# Patient Record
Sex: Male | Born: 1995 | Hispanic: Yes | Marital: Single | Smoking: Never smoker
Health system: Southern US, Community
[De-identification: ages and names within clinical notes are randomized; demographics above are authoritative.]

## PROBLEM LIST (undated history)

## (undated) DIAGNOSIS — Z789 Other specified health status: Secondary | ICD-10-CM

## (undated) HISTORY — PX: WISDOM TOOTH EXTRACTION: SHX21

---
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
## 2014-01-26 ENCOUNTER — Emergency Department (HOSPITAL_COMMUNITY): Payer: Medicaid Other

## 2014-01-26 ENCOUNTER — Emergency Department (HOSPITAL_COMMUNITY)
Admission: EM | Admit: 2014-01-26 | Discharge: 2014-01-27 | Disposition: A | Discharge status: Home / Self Care | Payer: Medicaid Other | Attending: Emergency Medicine | Admitting: Emergency Medicine

## 2014-01-26 ENCOUNTER — Encounter (HOSPITAL_COMMUNITY): Payer: Self-pay | Admitting: Emergency Medicine

## 2014-01-26 DIAGNOSIS — F17200 Nicotine dependence, unspecified, uncomplicated: Secondary | ICD-10-CM | POA: Insufficient documentation

## 2014-01-26 DIAGNOSIS — S99929A Unspecified injury of unspecified foot, initial encounter: Principal | ICD-10-CM

## 2014-01-26 DIAGNOSIS — S99919A Unspecified injury of unspecified ankle, initial encounter: Secondary | ICD-10-CM | POA: Diagnosis not present

## 2014-01-26 DIAGNOSIS — Y9289 Other specified places as the place of occurrence of the external cause: Secondary | ICD-10-CM | POA: Insufficient documentation

## 2014-01-26 DIAGNOSIS — Z79899 Other long term (current) drug therapy: Secondary | ICD-10-CM | POA: Insufficient documentation

## 2014-01-26 DIAGNOSIS — S8990XA Unspecified injury of unspecified lower leg, initial encounter: Secondary | ICD-10-CM | POA: Insufficient documentation

## 2014-01-26 DIAGNOSIS — Y9351 Activity, roller skating (inline) and skateboarding: Secondary | ICD-10-CM | POA: Diagnosis not present

## 2014-01-26 DIAGNOSIS — S99912A Unspecified injury of left ankle, initial encounter: Secondary | ICD-10-CM

## 2014-01-26 MED ORDER — METHOCARBAMOL 500 MG PO TABS: 500.0000 mg | ORAL_TABLET | Freq: Two times a day (BID) | ORAL | Status: DC

## 2014-01-26 MED ORDER — OXYCODONE-ACETAMINOPHEN 5-325 MG PO TABS
1.0000 | ORAL_TABLET | Freq: Once | ORAL | Status: AC
Start: 2014-01-26 — End: 2014-01-26
  Administered 2014-01-26: 1 via ORAL
  Filled 2014-01-26: qty 1

## 2014-01-26 MED ORDER — OXYCODONE-ACETAMINOPHEN 10-325 MG PO TABS: 1.0000 | ORAL_TABLET | Freq: Four times a day (QID) | ORAL | Status: DC | PRN

## 2014-01-26 MED ORDER — OXYCODONE-ACETAMINOPHEN 5-325 MG PO TABS
2.0000 | ORAL_TABLET | Freq: Once | ORAL | Status: AC
  Administered 2014-01-26: 2 via ORAL
  Filled 2014-01-26: qty 2

## 2014-01-26 NOTE — ED Notes (Signed)
Lorelle FormosaHanna, PA-C, at the bedside.  Reported pain is still 10/10 after percocet.  She gives verbal order for 1 more percocet tab and to prepare for discharge after cam walker is placed. Ortho  Paged.

## 2014-01-26 NOTE — ED Notes (Signed)
The pt injured his lt ankle roller blading just pta here   Ankle swollen medially and laterally.

## 2014-01-26 NOTE — ED Notes (Signed)
Patient still off the unit for x-ray

## 2014-01-26 NOTE — ED Provider Notes (Signed)
CSN: [id number]     Arrival date & time 01/26/14  2144 History   This chart was scribed for non-physician practitioner working with Glenn Gaskinsonald W Wickline, MD, by Modena JanskyAlbert Mejia, ED Scribe. This patient was seen in room TR08C/TR08C and the patient's care was started at 10:23 PM.   Chief Complaint  Patient presents with  . Ankle Pain   Patient is a 18 y.o. male presenting with ankle pain. The history is provided by the patient and medical records. No language interpreter was used.  Ankle Pain Associated symptoms: no back pain, no fever and no neck pain    HPI Comments: Glenn Mejia is a 18 y.o. male who presents to the Emergency Department complaining of constant moderate left ankle pain that started about an hour ago. He states that he injured his left ankle trying to do a trick on roller skates. He reports associated swelling and significant pain.  He did fall and has been unable to ambulate since that time.  He denies hitting his head, LOC, neck or back pain.  He states that he did not take any medication PTA. Pt denies alleviating factors and movement and palpation makes it worse.    History reviewed. No pertinent past medical history. History reviewed. No pertinent past surgical history. No family history on file. History  Substance Use Topics  . Smoking status: Current Every Day Smoker  . Smokeless tobacco: Not on file  . Alcohol Use: Yes    Review of Systems  Constitutional: Negative for fever and chills.  Gastrointestinal: Negative for nausea and vomiting.  Musculoskeletal: Positive for arthralgias, gait problem (unable to ambulate 2/2 pain and swelling) and joint swelling. Negative for back pain, neck pain and neck stiffness.  Skin: Negative for wound.  Neurological: Negative for numbness.  Hematological: Does not bruise/bleed easily.  Psychiatric/Behavioral: The patient is not nervous/anxious.   All other systems reviewed and are negative.   Allergies  Review of  patient's allergies indicates no known allergies.  Home Medications   Prior to Admission medications   Medication Sig Start Date End Date Taking? Authorizing Provider  methocarbamol (ROBAXIN) 500 MG tablet Take 1 tablet (500 mg total) by mouth 2 (two) times daily. 01/26/14   Glenn Geesey, PA-C  oxyCODONE-acetaminophen (PERCOCET) 10-325 MG per tablet Take 1 tablet by mouth every 6 (six) hours as needed for pain. 01/26/14   Glenn Satre, PA-C   BP 128/77  Pulse 85  Temp(Src) 98.1 F (36.7 C)  Resp 18  Physical Exam  Nursing note and vitals reviewed. Constitutional: He is oriented to person, place, and time. He appears well-developed and well-nourished. No distress.  HENT:  Head: Normocephalic and atraumatic.  Eyes: Conjunctivae are normal.  Neck: Normal range of motion.  Cardiovascular: Normal rate, regular rhythm and intact distal pulses.   Capillary refill < 3 sec  Pulmonary/Chest: Effort normal and breath sounds normal.  Musculoskeletal: He exhibits tenderness. He exhibits no edema.  ROM: very little ROM of the left ankle.   Significant swelling and ecchymosis of the lateral and medial portions of the ankle TTP along both the lateral and medial malleolus and joint lines  Neurological: He is alert and oriented to person, place, and time. Coordination normal.  Sensation intact to dull and sharp Strength 1/5 with resisted dorsiflexion and plantar flexion of the ankle 2/2 pain.    Skin: Skin is warm and dry. He is not diaphoretic.  No tenting of the skin  Psychiatric: He has a normal  mood and affect.    ED Course  Procedures (including critical care time) DIAGNOSTIC STUDIES:  COORDINATION OF CARE: 10:27 PM- Pt advised of plan for treatment and pt agrees.  Labs Review Labs Reviewed - No data to display  Imaging Review Dg Ankle Complete Left  01/26/2014   CLINICAL DATA:  Ankle pain.  EXAM: LEFT ANKLE COMPLETE - 3+ VIEW  COMPARISON:  None.  FINDINGS: There is  relative widening of the medial clear space of the ankle. No acute fracture. No dislocation. Marked soft tissue swelling, especially medially.  IMPRESSION: Widening of the medial ankle suggesting ligamentous tear/insufficiency.   Electronically Signed   By: Glenn Mejia M.D.   On: 01/26/2014 23:13     EKG Interpretation None      MDM   Final diagnoses:  Ankle injury, left, initial encounter   [facility] Glenn Mejia presents with left ankle swelling and pain while roller skating tonight.  Patient X-Ray negative for obvious fracture or dislocation, but concern for ligamentous injury. Pain managed in ED. Pt advised to follow up with orthopedics for further evaluation and treatment.  Patient given CAM walker and crutches while in ED, conservative therapy recommended and discussed. Patient will be dc home & is agreeable with above plan.  I have personally reviewed patient's vitals, nursing note and any pertinent labs or imaging.  At this time, it has been determined that no acute conditions requiring further emergency intervention. The patient/guardian have been advised of the diagnosis and plan. I reviewed all labs and imaging including any potential incidental findings. We have discussed signs and symptoms that warrant return to the ED, such as increasing pain, fevers, SOB.  Patient/guardian has voiced understanding and agreed to follow-up with the PCP or specialist in 3 days.  Vital signs are stable at discharge.   BP 128/77  Pulse 85  Temp(Src) 98.1 F (36.7 C)  Resp 18   I personally performed the services described in this documentation, which was scribed in my presence. The recorded information has been reviewed and is accurate.          Glenn Client Edin Kon, PA-C 01/26/14 2346

## 2014-01-26 NOTE — Discharge Instructions (Signed)
1. Medications: robaxin, percocet, usual home medications 2. Treatment: rest, drink plenty of fluids, wear brace and use crutches until seen by orthopedics 3. Follow Up: Please followup with Dr. Carola FrostHandy in 1 week for further evaluation  Ankle Sprain An ankle sprain is an injury to the strong, fibrous tissues (ligaments) that hold the bones of your ankle joint together.  CAUSES An ankle sprain is usually caused by a fall or by twisting your ankle. Ankle sprains most commonly occur when you step on the outer edge of your foot, and your ankle turns inward. People who participate in sports are more prone to these types of injuries.  SYMPTOMS   Pain in your ankle. The pain may be present at rest or only when you are trying to stand or walk.  Swelling.  Bruising. Bruising may develop immediately or within 1 to 2 days after your injury.  Difficulty standing or walking, particularly when turning corners or changing directions. DIAGNOSIS  Your caregiver will ask you details about your injury and perform a physical exam of your ankle to determine if you have an ankle sprain. During the physical exam, your caregiver will press on and apply pressure to specific areas of your foot and ankle. Your caregiver will try to move your ankle in certain ways. An X-ray exam may be done to be sure a bone was not broken or a ligament did not separate from one of the bones in your ankle (avulsion fracture).  TREATMENT  Certain types of braces can help stabilize your ankle. Your caregiver can make a recommendation for this. Your caregiver may recommend the use of medicine for pain. If your sprain is severe, your caregiver may refer you to a surgeon who helps to restore function to parts of your skeletal system (orthopedist) or a physical therapist. HOME CARE INSTRUCTIONS   Apply ice to your injury for 1-2 days or as directed by your caregiver. Applying ice helps to reduce inflammation and pain.  Put ice in a plastic  bag.  Place a towel between your skin and the bag.  Leave the ice on for 15-20 minutes at a time, every 2 hours while you are awake.  Only take over-the-counter or prescription medicines for pain, discomfort, or fever as directed by your caregiver.  Elevate your injured ankle above the level of your heart as much as possible for 2-3 days.  If your caregiver recommends crutches, use them as instructed. Gradually put weight on the affected ankle. Continue to use crutches or a cane until you can walk without feeling pain in your ankle.  If you have a plaster splint, wear the splint as directed by your caregiver. Do not rest it on anything harder than a pillow for the first 24 hours. Do not put weight on it. Do not get it wet. You may take it off to take a shower or bath.  You may have been given an elastic bandage to wear around your ankle to provide support. If the elastic bandage is too tight (you have numbness or tingling in your foot or your foot becomes cold and blue), adjust the bandage to make it comfortable.  If you have an air splint, you may blow more air into it or let air out to make it more comfortable. You may take your splint off at night and before taking a shower or bath. Wiggle your toes in the splint several times per day to decrease swelling. SEEK MEDICAL CARE IF:   You have  rapidly increasing bruising or swelling.  Your toes feel extremely cold or you lose feeling in your foot.  Your pain is not relieved with medicine. SEEK IMMEDIATE MEDICAL CARE IF:  Your toes are numb or blue.  You have severe pain that is increasing. MAKE SURE YOU:   Understand these instructions.  Will watch your condition.  Will get help right away if you are not doing well or get worse. Document Released: 05/24/2005 Document Revised: 02/16/2012 Document Reviewed: 06/05/2011 [facility] Patient Information 2015 [place], [place]. This information is not intended to replace advice given to you by  your health care provider. Make sure you discuss any questions you have with your health care provider.

## 2014-01-26 NOTE — ED Notes (Signed)
Ortho tech at the bedside.  

## 2014-01-26 NOTE — ED Provider Notes (Signed)
Medical screening examination/treatment/procedure(s) were performed by non-physician practitioner and as supervising physician I was immediately available for consultation/collaboration.   EKG Interpretation None        Joya Gaskinsonald W Cailean Heacock, MD 01/26/14 [id number]

## 2014-01-26 NOTE — ED Notes (Signed)
Glenn Mejia from Ortho will come to apply cam walker and provide crutches. Patient has mother as ride home.

## 2014-01-27 NOTE — Progress Notes (Signed)
Orthopedic Tech Progress Note Patient Details:  Glenn Mejia [facility] [birth year] [id number]  Ortho Devices Type of Ortho Device: CAM walker;Crutches Ortho Device/Splint Location: L LE Ortho Device/Splint Interventions: Application   Barbarann Kelly T 01/27/2014, 12:20 AM

## 2014-02-07 NOTE — Progress Notes (Signed)
Attempted to call pt for pre-op call. The number we have in EPIC for pt is incorrect. The person who answered the phone does not know anyone by the name of [facility]. The number listed for emergency contact is an invalid number. No other numbers to call are available.

## 2014-02-08 ENCOUNTER — Encounter (HOSPITAL_COMMUNITY): Payer: Self-pay | Admitting: Anesthesiology

## 2014-02-08 ENCOUNTER — Inpatient Hospital Stay (HOSPITAL_COMMUNITY): Admission: RE | Admit: 2014-02-08 | Payer: Medicaid Other | Source: Ambulatory Visit | Admitting: Orthopedic Surgery

## 2014-02-08 ENCOUNTER — Encounter (HOSPITAL_COMMUNITY): Admission: RE | Payer: Self-pay | Source: Ambulatory Visit

## 2014-02-08 SURGERY — OPEN REDUCTION INTERNAL FIXATION (ORIF) ANKLE FRACTURE
Anesthesia: General | Laterality: Left

## 2014-02-08 MED ORDER — PROPOFOL 10 MG/ML IV BOLUS
INTRAVENOUS | Status: AC
  Filled 2014-02-08: qty 20

## 2014-02-08 MED ORDER — FENTANYL CITRATE 0.05 MG/ML IJ SOLN
INTRAMUSCULAR | Status: AC
  Filled 2014-02-08: qty 5

## 2014-02-08 MED ORDER — MIDAZOLAM HCL 2 MG/2ML IJ SOLN
INTRAMUSCULAR | Status: AC
  Filled 2014-02-08: qty 2

## 2014-02-08 NOTE — Anesthesia Preprocedure Evaluation (Deleted)
Anesthesia Evaluation  Patient identified by MRN, date of birth, ID band Patient awake    Reviewed: Allergy & Precautions, H&P , NPO status , Patient's Chart, lab work & pertinent test results  Airway       Dental   Pulmonary Current Smoker,          Cardiovascular     Neuro/Psych    GI/Hepatic   Endo/Other    Renal/GU      Musculoskeletal   Abdominal   Peds  Hematology   Anesthesia Other Findings   Reproductive/Obstetrics                         Anesthesia Physical Anesthesia Plan  ASA: I  Anesthesia Plan: General   Post-op Pain Management:    Induction: Intravenous  Airway Management Planned: Oral ETT  Additional Equipment:   Intra-op Plan:   Post-operative Plan: Extubation in OR  Informed Consent: I have reviewed the patients History and Physical, chart, labs and discussed the procedure including the risks, benefits and alternatives for the proposed anesthesia with the patient or authorized representative who has indicated his/her understanding and acceptance.     Plan Discussed with: CRNA, Anesthesiologist and Surgeon  Anesthesia Plan Comments:         Anesthesia Quick Evaluation

## 2014-03-11 ENCOUNTER — Inpatient Hospital Stay (HOSPITAL_COMMUNITY)
Admission: RE | Admit: 2014-03-11 | Discharge: 2014-03-11 | Disposition: A | Discharge status: Home / Self Care | Payer: Medicaid Other | Source: Ambulatory Visit

## 2014-03-11 ENCOUNTER — Encounter (HOSPITAL_COMMUNITY): Payer: Self-pay | Admitting: *Deleted

## 2014-03-11 MED ORDER — CEFAZOLIN SODIUM-DEXTROSE 2-3 GM-% IV SOLR
2.0000 g | INTRAVENOUS | Status: AC
  Administered 2014-03-12: 2 g via INTRAVENOUS
  Filled 2014-03-11: qty 50

## 2014-03-11 NOTE — Pre-Procedure Instructions (Addendum)
Glenn Mejia  03/11/2014   Your procedure is scheduled on:  03-12-2014  Tuesday   [facility] Admitting at 9:00 AM.   Call this number if you have problems the morning of surgery: [phone]   Remember:   Do not eat food or drink liquids after midnight.    Take these medicines the morning of surgery with A SIP OF WATER: pain medication if needed,methocarbamo(Robaxin)   Do not wear jewelry,  Do not wear lotions, powders, or perfumes. You may not wear deodorant.  Do not shave 48 hours prior to surgery. Men may shave face and neck.   Do not bring valuables to the hospital.  [facility] is not responsible  for any belongings or valuables.               Contacts, dentures or bridgework may not be worn into surgery.   Leave suitcase in the car. After surgery it may be brought to your room.   For patients admitted to the hospital, discharge time is determined by your   treatment team.               Patients discharged the day of surgery will not be allowed to drive home.   Name and phone number of your driver:   Special Instructions: SEE ATTACHED SHEET FOR INSTRUCTIONS ON CHG SHOWER/BATH   Please read over the following fact sheets that you were given: Pain Booklet, Coughing and Deep Breathing and Surgical Site Infection Prevention

## 2014-03-11 NOTE — Progress Notes (Signed)
Pt does not need an interpreter, speaks very good [place].

## 2014-03-12 ENCOUNTER — Ambulatory Visit (HOSPITAL_COMMUNITY)
Admission: RE | Admit: 2014-03-12 | Discharge: 2014-03-12 | Disposition: A | Discharge status: Home / Self Care | Payer: Medicaid Other | Source: Ambulatory Visit | Attending: Orthopedic Surgery | Admitting: Orthopedic Surgery

## 2014-03-12 ENCOUNTER — Ambulatory Visit (HOSPITAL_COMMUNITY): Payer: Medicaid Other | Admitting: Anesthesiology

## 2014-03-12 ENCOUNTER — Encounter (HOSPITAL_COMMUNITY): Payer: Medicaid Other | Admitting: Anesthesiology

## 2014-03-12 ENCOUNTER — Ambulatory Visit (HOSPITAL_COMMUNITY): Payer: Medicaid Other

## 2014-03-12 ENCOUNTER — Encounter (HOSPITAL_COMMUNITY): Payer: Self-pay | Admitting: Pharmacy Technician

## 2014-03-12 ENCOUNTER — Encounter (HOSPITAL_COMMUNITY): Payer: Self-pay | Admitting: *Deleted

## 2014-03-12 ENCOUNTER — Encounter (HOSPITAL_COMMUNITY)
Admission: RE | Disposition: A | Discharge status: Home / Self Care | Payer: Self-pay | Source: Ambulatory Visit | Attending: Orthopedic Surgery

## 2014-03-12 DIAGNOSIS — S9302XA Subluxation of left ankle joint, initial encounter: Secondary | ICD-10-CM | POA: Insufficient documentation

## 2014-03-12 DIAGNOSIS — Y939 Activity, unspecified: Secondary | ICD-10-CM | POA: Diagnosis not present

## 2014-03-12 DIAGNOSIS — W19XXXA Unspecified fall, initial encounter: Secondary | ICD-10-CM | POA: Insufficient documentation

## 2014-03-12 DIAGNOSIS — Y998 Other external cause status: Secondary | ICD-10-CM | POA: Insufficient documentation

## 2014-03-12 DIAGNOSIS — M24872 Other specific joint derangements of left ankle, not elsewhere classified: Secondary | ICD-10-CM | POA: Diagnosis not present

## 2014-03-12 DIAGNOSIS — F17210 Nicotine dependence, cigarettes, uncomplicated: Secondary | ICD-10-CM | POA: Diagnosis not present

## 2014-03-12 DIAGNOSIS — S93432A Sprain of tibiofibular ligament of left ankle, initial encounter: Secondary | ICD-10-CM

## 2014-03-12 DIAGNOSIS — Y9289 Other specified places as the place of occurrence of the external cause: Secondary | ICD-10-CM | POA: Diagnosis not present

## 2014-03-12 DIAGNOSIS — S93492A Sprain of other ligament of left ankle, initial encounter: Secondary | ICD-10-CM | POA: Diagnosis present

## 2014-03-12 HISTORY — DX: Other specified health status: Z78.9

## 2014-03-12 HISTORY — PX: ORIF ANKLE FRACTURE: SHX5408

## 2014-03-12 LAB — CBC
HCT: 45.5 % (ref 39.0–52.0)
Hemoglobin: 16.1 g/dL (ref 13.0–17.0)
MCH: 32.3 pg (ref 26.0–34.0)
MCHC: 35.4 g/dL (ref 30.0–36.0)
MCV: 91.4 fL (ref 78.0–100.0)
PLATELETS: 198 10*3/uL (ref 150–400)
RBC: 4.98 MIL/uL (ref 4.22–5.81)
RDW: 12.4 % (ref 11.5–15.5)
WBC: 6.9 10*3/uL (ref 4.0–10.5)

## 2014-03-12 SURGERY — OPEN REDUCTION INTERNAL FIXATION (ORIF) ANKLE FRACTURE
Anesthesia: General | Laterality: Left

## 2014-03-12 MED ORDER — MEPERIDINE HCL 25 MG/ML IJ SOLN: 6.2500 mg | INTRAMUSCULAR | Status: DC | PRN

## 2014-03-12 MED ORDER — ONDANSETRON HCL 4 MG/2ML IJ SOLN
INTRAMUSCULAR | Status: AC
  Filled 2014-03-12: qty 4

## 2014-03-12 MED ORDER — ASPIRIN EC 325 MG PO TBEC: 325.0000 mg | DELAYED_RELEASE_TABLET | Freq: Every day | ORAL | Status: DC

## 2014-03-12 MED ORDER — OXYCODONE-ACETAMINOPHEN 5-325 MG PO TABS: 1.0000 | ORAL_TABLET | Freq: Four times a day (QID) | ORAL | Status: DC | PRN

## 2014-03-12 MED ORDER — VECURONIUM BROMIDE 10 MG IV SOLR
INTRAVENOUS | Status: DC | PRN
  Administered 2014-03-12: 2 mg via INTRAVENOUS

## 2014-03-12 MED ORDER — ACETAMINOPHEN 10 MG/ML IV SOLN
INTRAVENOUS | Status: AC
  Filled 2014-03-12: qty 100

## 2014-03-12 MED ORDER — BUPIVACAINE-EPINEPHRINE 0.25% -1:200000 IJ SOLN
INTRAMUSCULAR | Status: DC | PRN
  Administered 2014-03-12: 10 mL

## 2014-03-12 MED ORDER — OXYCODONE HCL 5 MG PO TABS: 5.0000 mg | ORAL_TABLET | Freq: Four times a day (QID) | ORAL | Status: DC | PRN

## 2014-03-12 MED ORDER — FENTANYL CITRATE 0.05 MG/ML IJ SOLN
INTRAMUSCULAR | Status: DC | PRN
  Administered 2014-03-12: 25 ug via INTRAVENOUS
  Administered 2014-03-12: 100 ug via INTRAVENOUS
  Administered 2014-03-12 (×2): 25 ug via INTRAVENOUS
  Administered 2014-03-12: 150 ug via INTRAVENOUS
  Administered 2014-03-12: 25 ug via INTRAVENOUS
  Administered 2014-03-12: 150 ug via INTRAVENOUS

## 2014-03-12 MED ORDER — LACTATED RINGERS IV SOLN
INTRAVENOUS | Status: DC
  Administered 2014-03-12 (×2): via INTRAVENOUS

## 2014-03-12 MED ORDER — MIDAZOLAM HCL 2 MG/2ML IJ SOLN
INTRAMUSCULAR | Status: AC
  Filled 2014-03-12: qty 2

## 2014-03-12 MED ORDER — PROMETHAZINE HCL 25 MG/ML IJ SOLN: 6.2500 mg | INTRAMUSCULAR | Status: DC | PRN

## 2014-03-12 MED ORDER — 0.9 % SODIUM CHLORIDE (POUR BTL) OPTIME
TOPICAL | Status: DC | PRN
  Administered 2014-03-12: 1000 mL

## 2014-03-12 MED ORDER — GLYCOPYRROLATE 0.2 MG/ML IJ SOLN
INTRAMUSCULAR | Status: AC
  Filled 2014-03-12: qty 4

## 2014-03-12 MED ORDER — LIDOCAINE HCL (CARDIAC) 20 MG/ML IV SOLN
INTRAVENOUS | Status: DC | PRN
  Administered 2014-03-12: 100 mg via INTRAVENOUS

## 2014-03-12 MED ORDER — FENTANYL CITRATE 0.05 MG/ML IJ SOLN
INTRAMUSCULAR | Status: AC
  Filled 2014-03-12: qty 5

## 2014-03-12 MED ORDER — OXYCODONE HCL 5 MG PO TABS
5.0000 mg | ORAL_TABLET | Freq: Once | ORAL | Status: AC
  Administered 2014-03-12: 5 mg via ORAL

## 2014-03-12 MED ORDER — DEXMEDETOMIDINE HCL 200 MCG/2ML IV SOLN
INTRAVENOUS | Status: DC | PRN
  Administered 2014-03-12: 12 ug via INTRAVENOUS
  Administered 2014-03-12: 16 ug via INTRAVENOUS

## 2014-03-12 MED ORDER — ACETAMINOPHEN 10 MG/ML IV SOLN
INTRAVENOUS | Status: DC | PRN
  Administered 2014-03-12: 1000 mg via INTRAVENOUS

## 2014-03-12 MED ORDER — OXYCODONE HCL 5 MG PO TABS
ORAL_TABLET | ORAL | Status: AC
  Filled 2014-03-12: qty 1

## 2014-03-12 MED ORDER — PROPOFOL 10 MG/ML IV BOLUS
INTRAVENOUS | Status: DC | PRN
  Administered 2014-03-12: 200 mg via INTRAVENOUS

## 2014-03-12 MED ORDER — ONDANSETRON HCL 4 MG PO TABS: 4.0000 mg | ORAL_TABLET | Freq: Three times a day (TID) | ORAL | Status: DC | PRN

## 2014-03-12 MED ORDER — NEOSTIGMINE METHYLSULFATE 10 MG/10ML IV SOLN
INTRAVENOUS | Status: DC | PRN
  Administered 2014-03-12: 3 mg via INTRAVENOUS

## 2014-03-12 MED ORDER — FENTANYL CITRATE 0.05 MG/ML IJ SOLN
INTRAMUSCULAR | Status: AC
  Filled 2014-03-12: qty 2

## 2014-03-12 MED ORDER — METHOCARBAMOL 500 MG PO TABS: 500.0000 mg | ORAL_TABLET | Freq: Four times a day (QID) | ORAL | Status: DC | PRN

## 2014-03-12 MED ORDER — ONDANSETRON HCL 4 MG/2ML IJ SOLN
INTRAMUSCULAR | Status: DC | PRN
  Administered 2014-03-12: 4 mg via INTRAVENOUS

## 2014-03-12 MED ORDER — BUPIVACAINE-EPINEPHRINE (PF) 0.25% -1:200000 IJ SOLN
INTRAMUSCULAR | Status: AC
  Filled 2014-03-12: qty 30

## 2014-03-12 MED ORDER — ROCURONIUM BROMIDE 100 MG/10ML IV SOLN
INTRAVENOUS | Status: DC | PRN
  Administered 2014-03-12: 50 mg via INTRAVENOUS

## 2014-03-12 MED ORDER — MIDAZOLAM HCL 5 MG/5ML IJ SOLN
INTRAMUSCULAR | Status: DC | PRN
  Administered 2014-03-12: 2 mg via INTRAVENOUS

## 2014-03-12 MED ORDER — GLYCOPYRROLATE 0.2 MG/ML IJ SOLN
INTRAMUSCULAR | Status: DC | PRN
  Administered 2014-03-12: .4 mg via INTRAVENOUS
  Administered 2014-03-12: 0.2 mg via INTRAVENOUS

## 2014-03-12 MED ORDER — FENTANYL CITRATE 0.05 MG/ML IJ SOLN
25.0000 ug | INTRAMUSCULAR | Status: DC | PRN
  Administered 2014-03-12 (×2): 25 ug via INTRAVENOUS

## 2014-03-12 SURGICAL SUPPLY — 69 items
BANDAGE ELASTIC 4 VELCRO ST LF (GAUZE/BANDAGES/DRESSINGS) ×3 IMPLANT
BANDAGE ELASTIC 6 VELCRO ST LF (GAUZE/BANDAGES/DRESSINGS) ×3 IMPLANT
BANDAGE ESMARK 6X9 LF (GAUZE/BANDAGES/DRESSINGS) IMPLANT
BIT DRILL 2.5 X LONG (BIT) ×1
BIT DRILL X LONG 2.5 (BIT) ×1 IMPLANT
BLADE SURG 10 STRL SS (BLADE) IMPLANT
BNDG COHESIVE 4X5 TAN STRL (GAUZE/BANDAGES/DRESSINGS) IMPLANT
BNDG ESMARK 6X9 LF (GAUZE/BANDAGES/DRESSINGS)
BNDG GAUZE ELAST 4 BULKY (GAUZE/BANDAGES/DRESSINGS) ×3 IMPLANT
BRUSH SCRUB DISP (MISCELLANEOUS) ×6 IMPLANT
COVER SURGICAL LIGHT HANDLE (MISCELLANEOUS) ×3 IMPLANT
DRAPE C-ARM 42X72 X-RAY (DRAPES) ×3 IMPLANT
DRAPE C-ARMOR (DRAPES) ×3 IMPLANT
DRAPE ORTHO SPLIT 77X108 STRL (DRAPES)
DRAPE PROXIMA HALF (DRAPES) ×3 IMPLANT
DRAPE SURG ORHT 6 SPLT 77X108 (DRAPES) IMPLANT
DRAPE U-SHAPE 47X51 STRL (DRAPES) IMPLANT
DRILL BIT X LONG 2.5 (BIT) ×2
DRSG ADAPTIC 3X8 NADH LF (GAUZE/BANDAGES/DRESSINGS) ×3 IMPLANT
DRSG EMULSION OIL 3X3 NADH (GAUZE/BANDAGES/DRESSINGS) IMPLANT
DRSG PAD ABDOMINAL 8X10 ST (GAUZE/BANDAGES/DRESSINGS) ×3 IMPLANT
ELECT REM PT RETURN 9FT ADLT (ELECTROSURGICAL) ×3
ELECTRODE REM PT RTRN 9FT ADLT (ELECTROSURGICAL) ×1 IMPLANT
GAUZE SPONGE 4X4 12PLY STRL (GAUZE/BANDAGES/DRESSINGS) IMPLANT
GLOVE BIO SURGEON STRL SZ7.5 (GLOVE) ×3 IMPLANT
GLOVE BIO SURGEON STRL SZ8 (GLOVE) ×3 IMPLANT
GLOVE BIOGEL PI IND STRL 7.5 (GLOVE) ×1 IMPLANT
GLOVE BIOGEL PI IND STRL 8 (GLOVE) ×1 IMPLANT
GLOVE BIOGEL PI INDICATOR 7.5 (GLOVE) ×2
GLOVE BIOGEL PI INDICATOR 8 (GLOVE) ×2
GOWN STRL REUS W/ TWL LRG LVL3 (GOWN DISPOSABLE) ×2 IMPLANT
GOWN STRL REUS W/ TWL XL LVL3 (GOWN DISPOSABLE) ×2 IMPLANT
GOWN STRL REUS W/TWL LRG LVL3 (GOWN DISPOSABLE) ×4
GOWN STRL REUS W/TWL XL LVL3 (GOWN DISPOSABLE) ×4
KIT 1/3 TUB PL 2H 25M (Orthopedic Implant) ×1 IMPLANT
KIT BASIN OR (CUSTOM PROCEDURE TRAY) ×3 IMPLANT
KIT ROOM TURNOVER OR (KITS) ×3 IMPLANT
MANIFOLD NEPTUNE II (INSTRUMENTS) ×3 IMPLANT
NEEDLE HYPO 21X1.5 SAFETY (NEEDLE) IMPLANT
NS IRRIG 1000ML POUR BTL (IV SOLUTION) ×3 IMPLANT
PACK GENERAL/GYN (CUSTOM PROCEDURE TRAY) IMPLANT
PACK ORTHO EXTREMITY (CUSTOM PROCEDURE TRAY) ×3 IMPLANT
PAD ARMBOARD 7.5X6 YLW CONV (MISCELLANEOUS) ×6 IMPLANT
PAD CAST 4YDX4 CTTN HI CHSV (CAST SUPPLIES) ×1 IMPLANT
PADDING CAST COTTON 4X4 STRL (CAST SUPPLIES) ×2
PADDING CAST COTTON 6X4 STRL (CAST SUPPLIES) ×3 IMPLANT
PENCIL BUTTON HOLSTER BLD 10FT (ELECTRODE) IMPLANT
PROS 1/3 TUB PL 2H 25M (Orthopedic Implant) ×3 IMPLANT
SCREW CORTEX 3.5 55MM (Screw) ×6 IMPLANT
SPLINT PLASTER CAST XFAST 5X30 (CAST SUPPLIES) ×1 IMPLANT
SPLINT PLASTER XFAST SET 5X30 (CAST SUPPLIES) ×2
SPONGE GAUZE 4X4 12PLY STER LF (GAUZE/BANDAGES/DRESSINGS) ×3 IMPLANT
SPONGE LAP 18X18 X RAY DECT (DISPOSABLE) ×3 IMPLANT
SPONGE SCRUB IODOPHOR (GAUZE/BANDAGES/DRESSINGS) IMPLANT
STAPLER VISISTAT 35W (STAPLE) ×3 IMPLANT
SUCTION FRAZIER TIP 10 FR DISP (SUCTIONS) ×3 IMPLANT
SUT ETHILON 2 0 FS 18 (SUTURE) ×3 IMPLANT
SUT ETHILON 3 0 PS 1 (SUTURE) ×3 IMPLANT
SUT PDS AB 2-0 CT1 27 (SUTURE) IMPLANT
SUT VIC AB 2-0 CT1 27 (SUTURE) ×2
SUT VIC AB 2-0 CT1 TAPERPNT 27 (SUTURE) ×1 IMPLANT
SUT VIC AB 2-0 CT3 27 (SUTURE) IMPLANT
SYR CONTROL 10ML LL (SYRINGE) IMPLANT
TOWEL OR 17X24 6PK STRL BLUE (TOWEL DISPOSABLE) ×3 IMPLANT
TOWEL OR 17X26 10 PK STRL BLUE (TOWEL DISPOSABLE) ×6 IMPLANT
TUBE CONNECTING 12'X1/4 (SUCTIONS) ×1
TUBE CONNECTING 12X1/4 (SUCTIONS) ×2 IMPLANT
UNDERPAD 30X30 INCONTINENT (UNDERPADS AND DIAPERS) ×3 IMPLANT
WATER STERILE IRR 1000ML POUR (IV SOLUTION) IMPLANT

## 2014-03-12 NOTE — Anesthesia Preprocedure Evaluation (Addendum)
Anesthesia Evaluation  Patient identified by MRN, date of birth, ID band Patient awake    Reviewed: Allergy & Precautions, H&P , NPO status , Patient's Chart, lab work & pertinent test results  Airway Mallampati: II TM Distance: >3 FB Neck ROM: full    Dental  (+) Teeth Intact   Pulmonary Current Smoker,  breath sounds clear to auscultation        Cardiovascular Rhythm:regular     Neuro/Psych    GI/Hepatic   Endo/Other    Renal/GU      Musculoskeletal   Abdominal   Peds  Hematology   Anesthesia Other Findings   Reproductive/Obstetrics                          Anesthesia Physical  Anesthesia Plan  ASA: II  Anesthesia Plan: General   Post-op Pain Management:    Induction: Intravenous  Airway Management Planned: Oral ETT  Additional Equipment:   Intra-op Plan:   Post-operative Plan: Extubation in OR  Informed Consent: I have reviewed the patients History and Physical, chart, labs and discussed the procedure including the risks, benefits and alternatives for the proposed anesthesia with the patient or authorized representative who has indicated his/her understanding and acceptance.     Plan Discussed with: CRNA, Anesthesiologist and Surgeon  Anesthesia Plan Comments:        Anesthesia Quick Evaluation

## 2014-03-12 NOTE — H&P (Signed)
I saw and examined the patient with Mr. Renae Fickleaul, communicating the findings and plan noted above.  I discussed with the patient the risks and benefits of surgery for repair of his left ankle injury, including the possibility of infection, nerve injury, vessel injury, wound breakdown, arthritis, symptomatic hardware, DVT/ PE, loss of motion, and need for further surgery among others.  We also specifically discussed the necessity of compliance with NWB postoperatively and that this could result in catastrophic consequences.  He acknowledged these risks and wished to proceed.  Myrene GalasMichael Felisha Claytor, MD [facility] [phone] (p)

## 2014-03-12 NOTE — H&P (Signed)
[facility] H&P  Chief Complaint:  L ankle syndesmotic disruption  HPI:   18 y/o male sustained L ankle syndesmotic injury. Was supposed to have surgery about 10 days ago but did not show up as he was very nervous. Pt seen in office again last week and we had extensive discussion about the importance for addressing his injury surgical and long term sequela if it is not.  Pt in agreement with plan and presents for ORIF of L ankle syndesmosis   Past Medical History  Diagnosis Date  . Medical history non-contributory     Past Surgical History  Procedure Laterality Date  . Wisdom tooth extraction      left lower     Family History  Problem Relation Age of Onset  . Kidney Stones Mother    Social History:  reports that he has been smoking Cigarettes.  He has been smoking about 0.00 packs per day. He does not have any smokeless tobacco history on file. He reports that he drinks alcohol. He reports that he uses illicit drugs (Marijuana).  Allergies: No Known Allergies  No prescriptions prior to admission    No results found for this or any previous visit (from the past 48 hour(s)). No results found.  Review of Systems  Constitutional: Negative for fever and chills.  Respiratory: Negative for shortness of breath and wheezing.   Cardiovascular: Negative for chest pain and palpitations.  Gastrointestinal: Negative for nausea and vomiting.  Neurological: Negative for tingling, sensory change and headaches.    There were no vitals taken for this visit. Physical Exam  Constitutional: He appears well-developed and well-nourished.  HENT:  Head: Normocephalic and atraumatic.  Eyes: Pupils are equal, round, and reactive to light.  Neck: Normal range of motion.  Cardiovascular: Normal rate and regular rhythm.   Respiratory: Effort normal and breath sounds normal.  GI: Bowel sounds are normal. There is no tenderness.  Musculoskeletal:  Left Lower Extremity    Distal  motor and sensory functions intact   TTP along syndesmotic region    Ext warm    Swelling controlled    Compartments soft    + DP pulse      Assessment/Plan  18 y/o hispanic male with L ankle syndesmotic disruption   OR for repair of syndesmosis outpt surgery  NWB x 8 weeks  Mearl LatinKeith W. Alexandros Ewan, PA-C [facility] [phone] (P)  03/12/2014, 8:19 AM

## 2014-03-12 NOTE — Discharge Instructions (Signed)

## 2014-03-12 NOTE — Anesthesia Postprocedure Evaluation (Signed)
  Anesthesia Post-op Note  Patient: Glenn Mejia  Procedure(s) Performed: Procedure(s): LEFT ANKLE SYNDOSMOSIS REPAIR (Left)  Patient Location: PACU  Anesthesia Type:General  Level of Consciousness: awake  Airway and Oxygen Therapy: Patient Spontanous Breathing  Post-op Pain: mild  Post-op Assessment: Post-op Vital signs reviewed  Post-op Vital Signs: Reviewed  Last Vitals:  Filed Vitals:   03/12/14 1626  BP: 124/77  Pulse: 68  Temp:   Resp: 15    Complications: No apparent anesthesia complications

## 2014-03-12 NOTE — Transfer of Care (Signed)
Immediate Anesthesia Transfer of Care Note  Patient: Glenn Mejia  Procedure(s) Performed: Procedure(s): LEFT ANKLE SYNDOSMOSIS REPAIR (Left)  Patient Location: PACU  Anesthesia Type:General  Level of Consciousness: awake and alert   Airway & Oxygen Therapy: Patient Spontanous Breathing and Patient connected to nasal cannula oxygen  Post-op Assessment: Report given to PACU RN, Post -op Vital signs reviewed and stable and Patient moving all extremities  Post vital signs: Reviewed and stable  Complications: No apparent anesthesia complications

## 2014-03-12 NOTE — Brief Op Note (Signed)
03/12/2014  3:53 PM  PATIENT:  Glenn Mejia  18 y.o. male  PRE-OPERATIVE DIAGNOSIS:   1. LEFT ANKLE RUPTURED SYNDESMOSIS, CHRONIC 2. LEFT ANKLE SUBLUXATION, CHRONIC  POST-OPERATIVE DIAGNOSIS:   1. LEFT ANKLE RUPTURED SYNDESMOSIS, CHRONIC 2. LEFT ANKLE SUBLUXATION, CHRONIC  PROCEDURE:  Procedure(s): 1. LEFT ANKLE SYNDESMOSIS OPEN REPAIR (Left) 2. OPEN REDUCTION OF ANKLE SUBLUXATION  SURGEON:  Surgeon(s) and Role:    * Budd PalmerMichael H Gwendelyn Lanting, MD - Primary  PHYSICIAN ASSISTANT: nONE  ANESTHESIA:   general  I/O:  Total I/O In: 1600 [I.V.:1600] Out: -   SPECIMEN:  No Specimen  TOURNIQUET:  * No tourniquets in log *  DICTATION: .Other Dictation: Dictation Number [id number]

## 2014-03-13 NOTE — Op Note (Signed)
NAME:  Glenn Mejia, Glenn Mejia          ACCOUNT NO.:  [id number]  MEDICAL RECORD NO.:  [id number]  LOCATION:  [facility]                         FACILITY:  [facility]  PHYSICIAN:  Doralee AlbinoMichael H. Carola FrostHandy, M.D. DATE OF BIRTH:  Nov 30, 1995  DATE OF PROCEDURE:  03/12/2014 DATE OF DISCHARGE:  03/12/2014                              OPERATIVE REPORT   PREOPERATIVE DIAGNOSES: 1. Left ankle ruptured syndesmosis, chronic. 2. Left ankle chronic subluxation.  POSTOPERATIVE DIAGNOSES: 1. Left ankle ruptured syndesmosis, chronic. 2. Left ankle chronic subluxation.  PROCEDURE: 1. Open reduction and repair of left ankle syndesmosis. 2. Open reduction of left ankle subluxation.  SURGEON:  Doralee AlbinoMichael H. Carola FrostHandy, M.D.  ASSISTANT:  None.  ANESTHESIA:  General.  COMPLICATIONS:  None.  TOURNIQUET:  None.  DISPOSITION:  To PACU.  CONDITION:  Stable.  BRIEF SUMMARY AND INDICATION FOR PROCEDURE:  Glenn Mejia is a 18- year-old male who sustained a left ankle fracture resulting in lateral subluxation of his talus and was seen in followup and scheduled for surgery.  The patient, however, did not show and presented to the office over a month later with the ankle healed in a subluxated position without reduction of the ankle joint or of the syndesmosis.  There is significant medial clear space, and I discussed with the patient the risks and benefits of revision, repair including the need to obtain a more fixation and to perform open reduction of both the syndesmosis as well as open reduction of the ankle joint and that this would likely be combined with quadricortical purchase of the fixation with the subsequent need for removal of that fixation as well.  I discussed the possibility of nerve injury, vessel injury, loss of reduction, hardware breakage, need for further surgery, arthritis, and many others.  The patient acknowledged these risks and did wish to proceed.  BRIEF SUMMARY OF PROCEDURE:  Glenn Mejia received  antibiotics preoperatively, was taken to operating room where general anesthesia was induced.  His left lower extremity was prepped and draped in usual sterile fashion.  No tourniquet was used during the procedure.  C-arm was brought in and multiple stress views were performed of the ankle demonstrating lateral subluxation of the talus relative to the tibia, but with only slight opening of the syndesmotic interval.  Under dynamic stress, additional gapping could be visualized there.  The incision was then made along the lateral aspect of approximately 4 cm and dissection carried over the anterior aspect of the fibula.  A lobster claw was placed proximal to this and then with gentle external rotation, the syndesmosis opened completely.  I removed the fibrinous debris that had accumulated within using both rongeur and curette.  I was careful to avoid any injury to the talus.  I then turned attention medially where a medial arthrotomy was made.  Dissection carried down to the talus and intervening fibrinous material was removed out of the medial gutter.  It was only several millimeters thick and was removed in its entirety.  As stated, there was noted to be no intervening tissue between the medial malleolus and the medial talus.  After this was performed, the [facility] at 20 degrees of anterior angulation from the  fibula and under direct visualization, I was able to reduce the syndesmosis. This did very nicely reduce the talus as well, and this was followed by placement of 2 screws through a 2-hole plate on the lateral fibula.  I did check the reduction, obtained provisionally with a clamp on mortise, AP, and lateral projections and then after the hardware was placed at the same, I was also cognizant of not over compressing the interval. All wounds were irrigated thoroughly and then closed in standard layered fashion using 0 Vicryl, 2-0 Vicryl, and 3-0 nylon for the  medial arthrotomy as well as 2-0 Vicryl and 3-0 nylon for the lateral incision. Sterile gently compressive dressing was applied, then a posterior stirrup splint.  The patient was awakened from anesthesia and transported to the PACU in stable condition.  PROGNOSIS:  Glenn Mejia will be non-weightbearing for the next 8 weeks.  Anticipate seeing him back in the office in 2 weeks for removal of sutures and conversion back into a Cam boot, and we will allow unrestricted range of motion at that time.  He will require hardware removal at 4-5 months.     Doralee Albino. Carola Frost, M.D.     [facility]MEDQ  D:  03/12/2014  T:  03/13/2014  Job:  [id number]

## 2014-03-14 ENCOUNTER — Encounter (HOSPITAL_COMMUNITY): Payer: Self-pay | Admitting: Orthopedic Surgery

## 2014-08-07 ENCOUNTER — Emergency Department: Payer: Self-pay | Admitting: Emergency Medicine

## 2014-08-20 ENCOUNTER — Ambulatory Visit (HOSPITAL_COMMUNITY): Payer: Medicaid Other | Admitting: Anesthesiology

## 2014-08-20 ENCOUNTER — Ambulatory Visit (HOSPITAL_COMMUNITY): Payer: Medicaid Other

## 2014-08-20 ENCOUNTER — Encounter (HOSPITAL_COMMUNITY): Payer: Self-pay | Admitting: *Deleted

## 2014-08-20 ENCOUNTER — Encounter (HOSPITAL_COMMUNITY)
Admission: RE | Disposition: A | Discharge status: Home / Self Care | Payer: Self-pay | Source: Ambulatory Visit | Attending: Orthopedic Surgery

## 2014-08-20 ENCOUNTER — Ambulatory Visit (HOSPITAL_COMMUNITY)
Admission: RE | Admit: 2014-08-20 | Discharge: 2014-08-20 | Disposition: A | Discharge status: Home / Self Care | Payer: Medicaid Other | Source: Ambulatory Visit | Attending: Orthopedic Surgery | Admitting: Orthopedic Surgery

## 2014-08-20 DIAGNOSIS — Z7982 Long term (current) use of aspirin: Secondary | ICD-10-CM | POA: Insufficient documentation

## 2014-08-20 DIAGNOSIS — F17210 Nicotine dependence, cigarettes, uncomplicated: Secondary | ICD-10-CM | POA: Insufficient documentation

## 2014-08-20 DIAGNOSIS — Z79899 Other long term (current) drug therapy: Secondary | ICD-10-CM | POA: Diagnosis not present

## 2014-08-20 DIAGNOSIS — Y839 Surgical procedure, unspecified as the cause of abnormal reaction of the patient, or of later complication, without mention of misadventure at the time of the procedure: Secondary | ICD-10-CM | POA: Diagnosis not present

## 2014-08-20 DIAGNOSIS — Z419 Encounter for procedure for purposes other than remedying health state, unspecified: Secondary | ICD-10-CM

## 2014-08-20 DIAGNOSIS — T8489XA Other specified complication of internal orthopedic prosthetic devices, implants and grafts, initial encounter: Secondary | ICD-10-CM | POA: Insufficient documentation

## 2014-08-20 HISTORY — PX: HARDWARE REMOVAL: SHX979

## 2014-08-20 SURGERY — REMOVAL, HARDWARE
Anesthesia: General | Site: Leg Lower | Laterality: Left

## 2014-08-20 MED ORDER — LACTATED RINGERS IV SOLN
INTRAVENOUS | Status: DC
  Administered 2014-08-20: 09:00:00 via INTRAVENOUS

## 2014-08-20 MED ORDER — MIDAZOLAM HCL 5 MG/5ML IJ SOLN
INTRAMUSCULAR | Status: DC | PRN
  Administered 2014-08-20: 2 mg via INTRAVENOUS

## 2014-08-20 MED ORDER — HYDROMORPHONE HCL 1 MG/ML IJ SOLN
0.2500 mg | INTRAMUSCULAR | Status: DC | PRN
  Administered 2014-08-20 (×3): 0.5 mg via INTRAVENOUS

## 2014-08-20 MED ORDER — HYDROMORPHONE HCL 1 MG/ML IJ SOLN
INTRAMUSCULAR | Status: AC
  Administered 2014-08-20: 0.5 mg via INTRAVENOUS
  Filled 2014-08-20: qty 2

## 2014-08-20 MED ORDER — CHLORHEXIDINE GLUCONATE 4 % EX LIQD
60.0000 mL | Freq: Once | CUTANEOUS | Status: DC
  Filled 2014-08-20: qty 60

## 2014-08-20 MED ORDER — CEFAZOLIN SODIUM-DEXTROSE 2-3 GM-% IV SOLR
INTRAVENOUS | Status: AC
  Filled 2014-08-20: qty 50

## 2014-08-20 MED ORDER — CEFAZOLIN SODIUM-DEXTROSE 2-3 GM-% IV SOLR
2.0000 g | INTRAVENOUS | Status: AC
  Administered 2014-08-20: 2 g via INTRAVENOUS

## 2014-08-20 MED ORDER — KETOROLAC TROMETHAMINE 10 MG PO TABS: 10.0000 mg | ORAL_TABLET | Freq: Four times a day (QID) | ORAL | Status: DC | PRN

## 2014-08-20 MED ORDER — HYDROMORPHONE HCL 1 MG/ML IJ SOLN
INTRAMUSCULAR | Status: AC
  Filled 2014-08-20: qty 1

## 2014-08-20 MED ORDER — FENTANYL CITRATE 0.05 MG/ML IJ SOLN
INTRAMUSCULAR | Status: DC | PRN
  Administered 2014-08-20: 25 ug via INTRAVENOUS
  Administered 2014-08-20: 50 ug via INTRAVENOUS
  Administered 2014-08-20 (×3): 25 ug via INTRAVENOUS
  Administered 2014-08-20 (×2): 50 ug via INTRAVENOUS

## 2014-08-20 MED ORDER — MIDAZOLAM HCL 2 MG/2ML IJ SOLN
INTRAMUSCULAR | Status: AC
  Filled 2014-08-20: qty 2

## 2014-08-20 MED ORDER — PROMETHAZINE HCL 25 MG/ML IJ SOLN: 6.2500 mg | INTRAMUSCULAR | Status: DC | PRN

## 2014-08-20 MED ORDER — LIDOCAINE HCL (CARDIAC) 20 MG/ML IV SOLN
INTRAVENOUS | Status: DC | PRN
  Administered 2014-08-20: 100 mg via INTRAVENOUS

## 2014-08-20 MED ORDER — BUPIVACAINE HCL 0.5 % IJ SOLN
INTRAMUSCULAR | Status: DC | PRN
  Administered 2014-08-20: 10 mL

## 2014-08-20 MED ORDER — HYDROCODONE-ACETAMINOPHEN 5-325 MG PO TABS: 1.0000 | ORAL_TABLET | Freq: Four times a day (QID) | ORAL | Status: DC | PRN

## 2014-08-20 MED ORDER — 0.9 % SODIUM CHLORIDE (POUR BTL) OPTIME
TOPICAL | Status: DC | PRN
  Administered 2014-08-20: 1000 mL

## 2014-08-20 MED ORDER — BUPIVACAINE-EPINEPHRINE (PF) 0.5% -1:200000 IJ SOLN
INTRAMUSCULAR | Status: AC
  Filled 2014-08-20: qty 30

## 2014-08-20 MED ORDER — PROPOFOL 10 MG/ML IV BOLUS
INTRAVENOUS | Status: DC | PRN
  Administered 2014-08-20: 200 mg via INTRAVENOUS

## 2014-08-20 MED ORDER — ONDANSETRON HCL 4 MG/2ML IJ SOLN
INTRAMUSCULAR | Status: DC | PRN
  Administered 2014-08-20: 4 mg via INTRAVENOUS

## 2014-08-20 MED ORDER — ALBUTEROL SULFATE HFA 108 (90 BASE) MCG/ACT IN AERS
INHALATION_SPRAY | RESPIRATORY_TRACT | Status: AC
  Filled 2014-08-20: qty 6.7

## 2014-08-20 MED ORDER — LACTATED RINGERS IV SOLN
INTRAVENOUS | Status: DC | PRN
  Administered 2014-08-20: 12:00:00 via INTRAVENOUS

## 2014-08-20 MED ORDER — FENTANYL CITRATE 0.05 MG/ML IJ SOLN
INTRAMUSCULAR | Status: AC
  Filled 2014-08-20: qty 5

## 2014-08-20 MED ORDER — LACTATED RINGERS IV SOLN: INTRAVENOUS | Status: DC

## 2014-08-20 SURGICAL SUPPLY — 62 items
BANDAGE ELASTIC 4 VELCRO ST LF (GAUZE/BANDAGES/DRESSINGS) ×3 IMPLANT
BANDAGE ELASTIC 6 VELCRO ST LF (GAUZE/BANDAGES/DRESSINGS) IMPLANT
BANDAGE ESMARK 6X9 LF (GAUZE/BANDAGES/DRESSINGS) IMPLANT
BNDG COHESIVE 6X5 TAN STRL LF (GAUZE/BANDAGES/DRESSINGS) IMPLANT
BNDG ESMARK 6X9 LF (GAUZE/BANDAGES/DRESSINGS)
BNDG GAUZE ELAST 4 BULKY (GAUZE/BANDAGES/DRESSINGS) IMPLANT
BRUSH SCRUB DISP (MISCELLANEOUS) ×6 IMPLANT
CANISTER SUCT 3000ML (MISCELLANEOUS) ×3 IMPLANT
CLEANER TIP ELECTROSURG 2X2 (MISCELLANEOUS) IMPLANT
CLOSURE WOUND 1/2 X4 (GAUZE/BANDAGES/DRESSINGS)
COVER SURGICAL LIGHT HANDLE (MISCELLANEOUS) ×6 IMPLANT
CUFF TOURNIQUET SINGLE 18IN (TOURNIQUET CUFF) IMPLANT
CUFF TOURNIQUET SINGLE 24IN (TOURNIQUET CUFF) IMPLANT
CUFF TOURNIQUET SINGLE 34IN LL (TOURNIQUET CUFF) IMPLANT
DRAPE C-ARM 42X72 X-RAY (DRAPES) ×3 IMPLANT
DRAPE C-ARMOR (DRAPES) IMPLANT
DRAPE OEC MINIVIEW 54X84 (DRAPES) IMPLANT
DRAPE U-SHAPE 47X51 STRL (DRAPES) ×3 IMPLANT
DRSG ADAPTIC 3X8 NADH LF (GAUZE/BANDAGES/DRESSINGS) IMPLANT
DRSG EMULSION OIL 3X3 NADH (GAUZE/BANDAGES/DRESSINGS) ×3 IMPLANT
ELECT REM PT RETURN 9FT ADLT (ELECTROSURGICAL) ×3
ELECTRODE REM PT RTRN 9FT ADLT (ELECTROSURGICAL) ×1 IMPLANT
EVACUATOR 1/8 PVC DRAIN (DRAIN) IMPLANT
GAUZE SPONGE 4X4 12PLY STRL (GAUZE/BANDAGES/DRESSINGS) ×3 IMPLANT
GLOVE BIO SURGEON STRL SZ7.5 (GLOVE) ×3 IMPLANT
GLOVE BIO SURGEON STRL SZ8 (GLOVE) ×3 IMPLANT
GLOVE BIOGEL PI IND STRL 7.5 (GLOVE) ×1 IMPLANT
GLOVE BIOGEL PI IND STRL 8 (GLOVE) ×1 IMPLANT
GLOVE BIOGEL PI INDICATOR 7.5 (GLOVE) ×2
GLOVE BIOGEL PI INDICATOR 8 (GLOVE) ×2
GOWN STRL REUS W/ TWL LRG LVL3 (GOWN DISPOSABLE) ×1 IMPLANT
GOWN STRL REUS W/ TWL XL LVL3 (GOWN DISPOSABLE) ×1 IMPLANT
GOWN STRL REUS W/TWL LRG LVL3 (GOWN DISPOSABLE) ×2
GOWN STRL REUS W/TWL XL LVL3 (GOWN DISPOSABLE) ×2
KIT BASIN OR (CUSTOM PROCEDURE TRAY) ×3 IMPLANT
KIT ROOM TURNOVER OR (KITS) ×3 IMPLANT
MANIFOLD NEPTUNE II (INSTRUMENTS) IMPLANT
NEEDLE 22X1 1/2 (OR ONLY) (NEEDLE) ×3 IMPLANT
NS IRRIG 1000ML POUR BTL (IV SOLUTION) ×3 IMPLANT
PACK ORTHO EXTREMITY (CUSTOM PROCEDURE TRAY) ×3 IMPLANT
PAD ARMBOARD 7.5X6 YLW CONV (MISCELLANEOUS) ×3 IMPLANT
PADDING CAST COTTON 6X4 STRL (CAST SUPPLIES) ×3 IMPLANT
SPONGE LAP 18X18 X RAY DECT (DISPOSABLE) ×3 IMPLANT
SPONGE SCRUB IODOPHOR (GAUZE/BANDAGES/DRESSINGS) IMPLANT
STAPLER VISISTAT 35W (STAPLE) IMPLANT
STOCKINETTE IMPERVIOUS LG (DRAPES) IMPLANT
STRIP CLOSURE SKIN 1/2X4 (GAUZE/BANDAGES/DRESSINGS) IMPLANT
SUCTION FRAZIER TIP 10 FR DISP (SUCTIONS) ×3 IMPLANT
SUT ETHILON 3 0 PS 1 (SUTURE) ×3 IMPLANT
SUT PDS AB 2-0 CT1 27 (SUTURE) IMPLANT
SUT VIC AB 0 CT1 27 (SUTURE)
SUT VIC AB 0 CT1 27XBRD ANBCTR (SUTURE) IMPLANT
SUT VIC AB 2-0 CT1 27 (SUTURE) ×2
SUT VIC AB 2-0 CT1 TAPERPNT 27 (SUTURE) ×1 IMPLANT
SYR CONTROL 10ML LL (SYRINGE) ×3 IMPLANT
TOWEL OR 17X24 6PK STRL BLUE (TOWEL DISPOSABLE) ×3 IMPLANT
TOWEL OR 17X26 10 PK STRL BLUE (TOWEL DISPOSABLE) ×6 IMPLANT
TUBE CONNECTING 12'X1/4 (SUCTIONS) ×1
TUBE CONNECTING 12X1/4 (SUCTIONS) ×2 IMPLANT
UNDERPAD 30X30 INCONTINENT (UNDERPADS AND DIAPERS) ×3 IMPLANT
WATER STERILE IRR 1000ML POUR (IV SOLUTION) IMPLANT
YANKAUER SUCT BULB TIP NO VENT (SUCTIONS) ×3 IMPLANT

## 2014-08-20 NOTE — Brief Op Note (Signed)
08/20/2014  2:04 PM  PATIENT:  Glenn Mejia  19 y.o. male  PRE-OPERATIVE DIAGNOSIS:  Symptomatic broken hardware, left ankle  POST-OPERATIVE DIAGNOSIS:  Symptomatic broken hardware, left ankle  PROCEDURE:  Procedure(s): 1. HARDWARE REMOVAL (Left) Ankle 2. Stress flouro left ankle syndesmosis  SURGEON:  Surgeon(s) and Role:    * Myrene GalasMichael Mynor Witkop, MD - Primary  PHYSICIAN ASSISTANT: None`  ANESTHESIA:   general  I/O:     SPECIMEN:  No Specimen  TOURNIQUET:  * No tourniquets in log *  DICTATION: .Other Dictation: Dictation Number [id number]

## 2014-08-20 NOTE — Discharge Instructions (Signed)
°What to eat: ° °For your first meals, you should eat lightly; only small meals initially.  If you do not have nausea, you may eat larger meals.  Avoid spicy, greasy and heavy food.   ° °General Anesthesia, Adult, Care After  °Refer to this sheet in the next few weeks. These instructions provide you with information on caring for yourself after your procedure. Your health care provider may also give you more specific instructions. Your treatment has been planned according to current medical practices, but problems sometimes occur. Call your health care provider if you have any problems or questions after your procedure.  °WHAT TO EXPECT AFTER THE PROCEDURE  °After the procedure, it is typical to experience:  °Sleepiness.  °Nausea and vomiting. °HOME CARE INSTRUCTIONS  °For the first 24 hours after general anesthesia:  °Have a responsible person with you.  °Do not drive a car. If you are alone, do not take public transportation.  °Do not drink alcohol.  °Do not take medicine that has not been prescribed by your health care provider.  °Do not sign important papers or make important decisions.  °You may resume a normal diet and activities as directed by your health care provider.  °Change bandages (dressings) as directed.  °If you have questions or problems that seem related to general anesthesia, call the hospital and ask for the anesthetist or anesthesiologist on call. °SEEK MEDICAL CARE IF:  °You have nausea and vomiting that continue the day after anesthesia.  °You develop a rash. °SEEK IMMEDIATE MEDICAL CARE IF:  °You have difficulty breathing.  °You have chest pain.  °You have any allergic problems. °Document Released: 08/30/2000 Document Revised: 01/24/2013 Document Reviewed: 12/07/2012  °ExitCare® Patient Information ©2014 [employer].  ° °Sore Throat  ° ° °A sore throat is a painful, burning, sore, or scratchy feeling of the throat. There may be pain or tenderness when swallowing or talking. You may have  other symptoms with a sore throat. These include coughing, sneezing, fever, or a swollen neck. A sore throat is often the first sign of another sickness. These sicknesses may include a cold, flu, strep throat, or an infection called mono. Most sore throats go away without medical treatment.  °HOME CARE  °Only take medicine as told by your doctor.  °Drink enough fluids to keep your pee (urine) clear or pale yellow.  °Rest as needed.  °Try using throat sprays, lozenges, or suck on hard candy (if older than 4 years or as told).  °Sip warm liquids, such as broth, herbal tea, or warm water with honey. Try sucking on frozen ice pops or drinking cold liquids.  °Rinse the mouth (gargle) with salt water. Mix 1 teaspoon salt with 8 ounces of water.  °Do not smoke. Avoid being around others when they are smoking.  °Put a humidifier in your bedroom at night to moisten the air. You can also turn on a hot shower and sit in the bathroom for 5-10 minutes. Be sure the bathroom door is closed. °GET HELP RIGHT AWAY IF:  °You have trouble breathing.  °You cannot swallow fluids, soft foods, or your spit (saliva).  °You have more puffiness (swelling) in the throat.  °Your sore throat does not get better in 7 days.  °You feel sick to your stomach (nauseous) and throw up (vomit).  °You have a fever or lasting symptoms for more than 2-3 days.  °You have a fever and your symptoms suddenly get worse. °MAKE SURE YOU:  °Understand these   instructions.  °Will watch your condition.  °Will get help right away if you are not doing well or get worse. °Document Released: 03/02/2008 Document Revised: 02/16/2012 Document Reviewed: 01/30/2012  °ExitCare® Patient Information ©2015 [employer]. This information is not intended to replace advice given to you by your health care provider. Make sure you discuss any questions you have with your health care provider.  ° ° ° °

## 2014-08-20 NOTE — H&P (Signed)
[facility]      Chief Complaint: symptomatic HW L ankle HPI:   19 y/o male s/p repair of chronic L ankle syndesmotic disruption. Has done well since surgery. [facility] as it is symptomatic and both syndesmotic screws have broken.   Past Medical History  Diagnosis Date  . Medical history non-contributory     Past Surgical History  Procedure Laterality Date  . Wisdom tooth extraction      left lower   . Orif ankle fracture Left 03/12/2014    Procedure: LEFT ANKLE SYNDOSMOSIS REPAIR;  Surgeon: Budd PalmerMichael H Handy, MD;  Location: [facility] OR;  Service: Orthopedics;  Laterality: Left;    Family History  Problem Relation Age of Onset  . Kidney Stones Mother    Social History:  reports that he has been smoking Cigarettes.  He does not have any smokeless tobacco history on file. He reports that he drinks alcohol. He reports that he uses illicit drugs (Marijuana).  Allergies: No Known Allergies  No prescriptions prior to admission     No results found for this or any previous visit (from the past 48 hour(s)). No results found.  Review of Systems  Constitutional: Negative for fever and chills.  Respiratory: Negative for shortness of breath and wheezing.   Cardiovascular: Negative for chest pain and palpitations.  Gastrointestinal: Negative for nausea, vomiting and abdominal pain.  Neurological: Negative for tingling, sensory change and headaches.    Vitals to be take on arrival to short stay  Physical Exam  Constitutional: He is oriented to person, place, and time. He appears well-developed and well-nourished.  HENT:  Head: Normocephalic.  Cardiovascular: Normal rate and regular rhythm.   Respiratory: Effort normal and breath sounds normal.  GI: Soft. Bowel sounds are normal.  Musculoskeletal:  L ankle Surgical wound healed Distal motor and sensory functions intact Ext warm + DP pulse No DCT Good ankle ROM   Neurological: He is alert and oriented to  person, place, and time.  Skin: Skin is warm.  Psychiatric: He has a normal mood and affect. His behavior is normal.     Assessment/Plan  19 y/o male with symptomatic HW L ankle  OR for removal of Hardware L ankle outpt  Procedure No restrictions post op WBAT post op   Mearl LatinKeith W. Cherl Gorney, PA-C [facility] [phone] (P) 08/20/2014, 7:56 AM

## 2014-08-20 NOTE — Anesthesia Procedure Notes (Signed)
Procedure Name: LMA Insertion Date/Time: 08/20/2014 12:49 PM Performed by: Dairl PonderJIANG, Trysten Berti Pre-anesthesia Checklist: Patient identified, Timeout performed, Emergency Drugs available, Suction available and Patient being monitored Patient Re-evaluated:Patient Re-evaluated prior to inductionOxygen Delivery Method: Circle system utilized Preoxygenation: Pre-oxygenation with 100% oxygen Intubation Type: IV induction LMA: LMA inserted Laryngoscope Size: 5 Tube type: Oral Number of attempts: 1 Placement Confirmation: ETT inserted through vocal cords under direct vision,  positive ETCO2 and breath sounds checked- equal and bilateral Tube secured with: Tape Dental Injury: Teeth and Oropharynx as per pre-operative assessment

## 2014-08-20 NOTE — Anesthesia Postprocedure Evaluation (Signed)
  Anesthesia Post-op Note  Patient: Glenn Mejia  Procedure(s) Performed: Procedure(s): HARDWARE REMOVAL (Left)  Patient Location: PACU  Anesthesia Type:General  Level of Consciousness: awake, alert  and oriented  Airway and Oxygen Therapy: Patient Spontanous Breathing  Post-op Pain: 3 /10  Post-op Assessment: Post-op Vital signs reviewed, Patient's Cardiovascular Status Stable, Respiratory Function Stable, Patent Airway and No signs of Nausea or vomiting  Post-op Vital Signs: Reviewed and stable  Last Vitals:  Filed Vitals:   08/20/14 1515  BP:   Pulse: 66  Temp:   Resp: 16    Complications: No apparent anesthesia complications

## 2014-08-20 NOTE — Anesthesia Preprocedure Evaluation (Signed)
Anesthesia Evaluation  Patient identified by MRN, date of birth, ID band Patient awake    Reviewed: Allergy & Precautions, H&P , NPO status , Patient's Chart, lab work & pertinent test results  Airway Mallampati: II  TM Distance: >3 FB Neck ROM: full    Dental  (+) Teeth Intact   Pulmonary Current Smoker,  breath sounds clear to auscultation        Cardiovascular negative cardio ROS  Rhythm:regular     Neuro/Psych    GI/Hepatic negative GI ROS, Neg liver ROS,   Endo/Other  negative endocrine ROS  Renal/GU negative Renal ROS     Musculoskeletal negative musculoskeletal ROS (+)   Abdominal   Peds  Hematology negative hematology ROS (+)   Anesthesia Other Findings   Reproductive/Obstetrics negative OB ROS                             Anesthesia Physical  Anesthesia Plan  ASA: II  Anesthesia Plan: General   Post-op Pain Management:    Induction: Intravenous  Airway Management Planned: Oral ETT  Additional Equipment:   Intra-op Plan:   Post-operative Plan: Extubation in OR  Informed Consent: I have reviewed the patients History and Physical, chart, labs and discussed the procedure including the risks, benefits and alternatives for the proposed anesthesia with the patient or authorized representative who has indicated his/her understanding and acceptance.     Plan Discussed with: CRNA, Anesthesiologist and Surgeon  Anesthesia Plan Comments: (03/2014: Grade 1; Self; Endotracheal Tube; Miller, 3; Oral; 7.5 mm; Cuffed; 1; Direct Visualization, Chest Rise, ETCO2 (Capnography), Bilateral Breath Sounds; 23 cm)        Anesthesia Quick Evaluation

## 2014-08-20 NOTE — Transfer of Care (Signed)
Immediate Anesthesia Transfer of Care Note  Patient: Glenn Mejia  Procedure(s) Performed: Procedure(s): HARDWARE REMOVAL (Left)  Patient Location: PACU  Anesthesia Type:General  Level of Consciousness: awake, alert  and oriented  Airway & Oxygen Therapy: Patient Spontanous Breathing  Post-op Assessment: Report given to RN and Post -op Vital signs reviewed and stable  Post vital signs: Reviewed and stable  Last Vitals:  Filed Vitals:   08/20/14 1417  BP: 111/52  Pulse: 78  Temp: 36.7 C  Resp: 25    Complications: No apparent anesthesia complications

## 2014-08-21 ENCOUNTER — Encounter (HOSPITAL_COMMUNITY): Payer: Self-pay | Admitting: Orthopedic Surgery

## 2014-08-21 NOTE — Op Note (Signed)
NAME:  Glenn Mejia, Glenn Mejia          ACCOUNT NO.:  [id number]  MEDICAL RECORD NO.:  [id number]  LOCATION:  [facility]                         FACILITY:  [facility]  PHYSICIAN:  Doralee AlbinoMichael H. Carola FrostHandy, M.D. DATE OF BIRTH:  03-Feb-1996  DATE OF PROCEDURE:  08/20/2014 DATE OF DISCHARGE:  08/20/2014                              OPERATIVE REPORT   PREOPERATIVE DIAGNOSIS:  Symptomatic hardware, broken, left ankle.  POSTOPERATIVE DIAGNOSIS:  Symptomatic hardware, broken, left ankle.  PROCEDURES: 1. Removal of broken deep implants, left ankle. 2. Stress fluoroscopic evaluation of left ankle syndesmosis.  SURGEON:  Doralee AlbinoMichael H. Carola FrostHandy, M.D.  ASSISTANT:  None.  ANESTHESIA:  General.  COMPLICATIONS:  None.  TOURNIQUET:  None.  DISPOSITION:  To PACU.  CONDITION:  Stable.  BRIEF SUMMARY OF INDICATION FOR PROCEDURE:  Glenn Mejia is a 19- year-old male, who sustained a ruptured syndesmosis that I initially saw and evaluated him for.  I recommended surgical repair at that time, but the patient was nervous about surgery and refused to proceed for several months.  He presented late with talar subluxation and we are able to repair the syndesmosis and restore appropriate alignment to the ankle joint.  I recommended retention of the screws for 5-6 months to make sure that he went on to complete uneventful union.  The patient was seen approximately a month ago with loosening of his screws, but no breakage and was advised to consider surgery within the next few weeks.  He showed up on a delayed basis and that time did have breakage of both screws.  I discussed with them removal of all the hardware versus what was easily accessible and he agreed with the recommendation and removed what was accessible including removal of the more proximal syndesmotic screw that protruded through the medial cortex slightly.  Understood the complications to include, nerve injury, vessel injury, infection, failure to alleviate  symptoms, need further surgery, the possibility of occult instability of the syndesmosis and others, and did wish to proceed.  BRIEF SUMMARY OF PROCEDURE:  The patient was given preoperative antibiotics, taken to the operating room where general anesthesia was induced.  His left lower extremity was prepped and draped in usual sterile fashion.  The old lateral incision was made, dissection carried down to the plate where the soft tissues were elevated off and the plate and heads of the screws removed without difficulty.  I then palpated the tip of the more proximal syndesmotic screw, which had been placed cortically to improve fixation because of the late reconstruction and was able to dissect down through a 1-cm incision.  I grasped it with the tip of the Needle-nose pliers, but was unable to extract it.  I did use the trephine and then the broken screw capture device and without violating the medial cortex of the tibia, was able to withdraw the screw uneventfully.  The more distal screw that was entirely interosseous was left in place.  Wounds were irrigated thoroughly and closed in standard layered fashion with 2-0 Vicryl, 3-0 nylon and then injected with 0.25% Marcaine with epi.  C-arm was then used to obtain a mortise view of the ankle and a stress view was then obtained, which demonstrated solid  healing of the syndesmosis with no widening of the medial clear space of talar translation.  The patient was then taken to PACU in stable condition.  PROGNOSIS:  The patient will be weightbearing as tolerated with no restrictions on the left ankle.  We will plan to see him back in 10-14 days for removal of his sutures.     Doralee Albino. Carola Frost, M.D.     [facility]MEDQ  D:  08/20/2014  T:  08/21/2014  Job:  [id number]

## 2014-08-22 NOTE — Addendum Note (Signed)
Addendum  created 08/22/14 1112 by Milly JakobJanakiram Terianna Peggs, MD   Modules edited: Anesthesia Attestations

## 2017-02-27 ENCOUNTER — Emergency Department (HOSPITAL_COMMUNITY): Payer: No Typology Code available for payment source

## 2017-02-27 ENCOUNTER — Encounter (HOSPITAL_COMMUNITY): Payer: Self-pay

## 2017-02-27 DIAGNOSIS — Y92410 Unspecified street and highway as the place of occurrence of the external cause: Secondary | ICD-10-CM | POA: Diagnosis not present

## 2017-02-27 DIAGNOSIS — R109 Unspecified abdominal pain: Secondary | ICD-10-CM | POA: Diagnosis not present

## 2017-02-27 DIAGNOSIS — Y9389 Activity, other specified: Secondary | ICD-10-CM | POA: Insufficient documentation

## 2017-02-27 DIAGNOSIS — R079 Chest pain, unspecified: Secondary | ICD-10-CM | POA: Insufficient documentation

## 2017-02-27 DIAGNOSIS — Y999 Unspecified external cause status: Secondary | ICD-10-CM | POA: Insufficient documentation

## 2017-02-27 DIAGNOSIS — R7989 Other specified abnormal findings of blood chemistry: Secondary | ICD-10-CM | POA: Diagnosis not present

## 2017-02-27 DIAGNOSIS — M791 Myalgia: Secondary | ICD-10-CM | POA: Insufficient documentation

## 2017-02-27 DIAGNOSIS — Z23 Encounter for immunization: Secondary | ICD-10-CM | POA: Diagnosis not present

## 2017-02-27 DIAGNOSIS — R51 Headache: Secondary | ICD-10-CM | POA: Diagnosis not present

## 2017-02-27 LAB — COMPREHENSIVE METABOLIC PANEL
ALT: 134 U/L — ABNORMAL HIGH (ref 17–63)
ANION GAP: 9 (ref 5–15)
AST: 70 U/L — AB (ref 15–41)
Albumin: 4.2 g/dL (ref 3.5–5.0)
Alkaline Phosphatase: 72 U/L (ref 38–126)
BUN: 11 mg/dL (ref 6–20)
CHLORIDE: 103 mmol/L (ref 101–111)
CO2: 25 mmol/L (ref 22–32)
Calcium: 9 mg/dL (ref 8.9–10.3)
Creatinine, Ser: 0.9 mg/dL (ref 0.61–1.24)
Glucose, Bld: 110 mg/dL — ABNORMAL HIGH (ref 65–99)
POTASSIUM: 3.9 mmol/L (ref 3.5–5.1)
Sodium: 137 mmol/L (ref 135–145)
Total Bilirubin: 0.7 mg/dL (ref 0.3–1.2)
Total Protein: 6.5 g/dL (ref 6.5–8.1)

## 2017-02-27 LAB — CBC
HEMATOCRIT: 44.5 % (ref 39.0–52.0)
HEMOGLOBIN: 14.9 g/dL (ref 13.0–17.0)
MCH: 31.9 pg (ref 26.0–34.0)
MCHC: 33.5 g/dL (ref 30.0–36.0)
MCV: 95.3 fL (ref 78.0–100.0)
Platelets: 206 10*3/uL (ref 150–400)
RBC: 4.67 MIL/uL (ref 4.22–5.81)
RDW: 12.9 % (ref 11.5–15.5)
WBC: 8.5 10*3/uL (ref 4.0–10.5)

## 2017-02-27 LAB — I-STAT CHEM 8, ED
BUN: 13 mg/dL (ref 6–20)
CREATININE: 0.9 mg/dL (ref 0.61–1.24)
Calcium, Ion: 1.12 mmol/L — ABNORMAL LOW (ref 1.15–1.40)
Chloride: 101 mmol/L (ref 101–111)
GLUCOSE: 109 mg/dL — AB (ref 65–99)
HEMATOCRIT: 46 % (ref 39.0–52.0)
HEMOGLOBIN: 15.6 g/dL (ref 13.0–17.0)
Potassium: 3.7 mmol/L (ref 3.5–5.1)
Sodium: 139 mmol/L (ref 135–145)
TCO2: 28 mmol/L (ref 22–32)

## 2017-02-27 LAB — SAMPLE TO BLOOD BANK

## 2017-02-27 LAB — ETHANOL: Alcohol, Ethyl (B): <5 mg/dL (ref <5)

## 2017-02-27 LAB — I-STAT CG4 LACTIC ACID, ED: Lactic Acid, Venous: 1.77 mmol/L (ref 0.5–1.9)

## 2017-02-27 LAB — PROTIME-INR
INR: 0.97
PROTHROMBIN TIME: 12.8 s (ref 11.4–15.2)

## 2017-02-27 NOTE — ED Notes (Signed)
Patient having neck pain also.
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
## 2017-02-28 ENCOUNTER — Emergency Department (HOSPITAL_COMMUNITY): Payer: No Typology Code available for payment source

## 2017-02-28 ENCOUNTER — Emergency Department (HOSPITAL_COMMUNITY)
Admission: EM | Admit: 2017-02-28 | Discharge: 2017-02-28 | Disposition: A | Discharge status: Home / Self Care | Payer: No Typology Code available for payment source | Attending: Emergency Medicine | Admitting: Emergency Medicine

## 2017-02-28 DIAGNOSIS — R7989 Other specified abnormal findings of blood chemistry: Secondary | ICD-10-CM

## 2017-02-28 DIAGNOSIS — R945 Abnormal results of liver function studies: Secondary | ICD-10-CM

## 2017-02-28 MED ORDER — IBUPROFEN 800 MG PO TABS: 800.0000 mg | ORAL_TABLET | Freq: Three times a day (TID) | ORAL | 0 refills | Status: DC

## 2017-02-28 MED ORDER — CYCLOBENZAPRINE HCL 10 MG PO TABS: 10.0000 mg | ORAL_TABLET | Freq: Two times a day (BID) | ORAL | 0 refills | Status: DC | PRN

## 2017-02-28 MED ORDER — METAXALONE 800 MG PO TABS: 800.0000 mg | ORAL_TABLET | Freq: Three times a day (TID) | ORAL | 0 refills | Status: DC

## 2017-02-28 MED ORDER — KETOROLAC TROMETHAMINE 30 MG/ML IJ SOLN
30.0000 mg | Freq: Once | INTRAMUSCULAR | Status: AC
  Administered 2017-02-28: 30 mg via INTRAVENOUS
  Filled 2017-02-28: qty 1

## 2017-02-28 MED ORDER — TETANUS-DIPHTH-ACELL PERTUSSIS 5-2.5-18.5 LF-MCG/0.5 IM SUSP
0.5000 mL | Freq: Once | INTRAMUSCULAR | Status: AC
  Administered 2017-02-28: 0.5 mL via INTRAMUSCULAR
  Filled 2017-02-28: qty 0.5

## 2017-02-28 MED ORDER — IOPAMIDOL (ISOVUE-300) INJECTION 61%
INTRAVENOUS | Status: AC
Start: 2017-02-28 — End: 2017-02-28
  Administered 2017-02-28: 100 mL
  Filled 2017-02-28: qty 100
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
## 2017-02-28 NOTE — ED Notes (Signed)
Patient transported to X-ray 

## 2017-02-28 NOTE — ED Notes (Signed)
Patient transported to CT 
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
## 2017-03-01 LAB — CDS SEROLOGY

## 2018-10-03 ENCOUNTER — Emergency Department
Admission: EM | Admit: 2018-10-03 | Discharge: 2018-10-03 | Disposition: A | Discharge status: Home / Self Care | Payer: HRSA Program | Attending: Emergency Medicine | Admitting: Emergency Medicine

## 2018-10-03 ENCOUNTER — Encounter: Payer: Self-pay | Admitting: Emergency Medicine

## 2018-10-03 DIAGNOSIS — Z20828 Contact with and (suspected) exposure to other viral communicable diseases: Secondary | ICD-10-CM | POA: Diagnosis not present

## 2018-10-03 DIAGNOSIS — R110 Nausea: Secondary | ICD-10-CM | POA: Insufficient documentation

## 2018-10-03 DIAGNOSIS — R05 Cough: Secondary | ICD-10-CM | POA: Diagnosis not present

## 2018-10-03 DIAGNOSIS — R6889 Other general symptoms and signs: Secondary | ICD-10-CM

## 2018-10-03 DIAGNOSIS — Z20822 Contact with and (suspected) exposure to covid-19: Secondary | ICD-10-CM

## 2018-10-03 DIAGNOSIS — R6883 Chills (without fever): Secondary | ICD-10-CM | POA: Insufficient documentation

## 2018-10-03 DIAGNOSIS — R51 Headache: Secondary | ICD-10-CM

## 2018-10-03 DIAGNOSIS — R519 Headache, unspecified: Secondary | ICD-10-CM

## 2018-10-03 LAB — CBC
HCT: 45 % (ref 39.0–52.0)
Hemoglobin: 16 g/dL (ref 13.0–17.0)
MCH: 32.2 pg (ref 26.0–34.0)
MCHC: 35.6 g/dL (ref 30.0–36.0)
MCV: 90.5 fL (ref 80.0–100.0)
Platelets: 206 10*3/uL (ref 150–400)
RBC: 4.97 MIL/uL (ref 4.22–5.81)
RDW: 12.3 % (ref 11.5–15.5)
WBC: 8.3 10*3/uL (ref 4.0–10.5)
nRBC: 0 % (ref 0.0–0.2)

## 2018-10-03 LAB — URINALYSIS, COMPLETE (UACMP) WITH MICROSCOPIC
Bacteria, UA: NONE SEEN
Bilirubin Urine: NEGATIVE
Glucose, UA: NEGATIVE mg/dL
Hgb urine dipstick: NEGATIVE
Ketones, ur: NEGATIVE mg/dL
Leukocytes,Ua: NEGATIVE
Nitrite: NEGATIVE
Protein, ur: NEGATIVE mg/dL
Specific Gravity, Urine: 1.015 (ref 1.005–1.030)
Squamous Epithelial / HPF: NONE SEEN (ref 0–5)
WBC, UA: NONE SEEN WBC/hpf (ref 0–5)
pH: 7 (ref 5.0–8.0)

## 2018-10-03 LAB — BASIC METABOLIC PANEL
Anion gap: 10 (ref 5–15)
BUN: 13 mg/dL (ref 6–20)
CO2: 28 mmol/L (ref 22–32)
Calcium: 8.8 mg/dL — ABNORMAL LOW (ref 8.9–10.3)
Chloride: 100 mmol/L (ref 98–111)
Creatinine, Ser: 0.77 mg/dL (ref 0.61–1.24)
GFR calc Af Amer: >60 mL/min (ref >60)
GFR calc non Af Amer: >60 mL/min (ref >60)
Glucose, Bld: 113 mg/dL — ABNORMAL HIGH (ref 70–99)
Potassium: 3.9 mmol/L (ref 3.5–5.1)
Sodium: 138 mmol/L (ref 135–145)

## 2018-10-03 LAB — SARS CORONAVIRUS 2 BY RT PCR (HOSPITAL ORDER, PERFORMED IN ~~LOC~~ HOSPITAL LAB): SARS Coronavirus 2: NEGATIVE

## 2018-10-03 MED ORDER — METOCLOPRAMIDE HCL 10 MG PO TABS
10.0000 mg | ORAL_TABLET | Freq: Once | ORAL | Status: AC
  Administered 2018-10-03: 10 mg via ORAL
  Filled 2018-10-03: qty 1

## 2018-10-03 MED ORDER — ACETAMINOPHEN 325 MG PO TABS
650.0000 mg | ORAL_TABLET | Freq: Once | ORAL | Status: AC
  Administered 2018-10-03: 14:00:00 650 mg via ORAL
  Filled 2018-10-03: qty 2

## 2018-10-03 MED ORDER — SODIUM CHLORIDE 0.9% FLUSH: 3.0000 mL | Freq: Once | INTRAVENOUS | Status: DC

## 2018-10-03 MED ORDER — ONDANSETRON 4 MG PO TBDP: 4.0000 mg | ORAL_TABLET | Freq: Three times a day (TID) | ORAL | 0 refills | Status: AC | PRN

## 2018-10-03 NOTE — Discharge Instructions (Addendum)
You are being tested for COVID-19. You are to remain at home, in quarantine, until you are notified by phone, or your results. Continue to treat your symptoms, with Tylenol, nausea medicine, and cough medicine, as needed. Return to the ED for shortness of breath.

## 2018-10-03 NOTE — ED Provider Notes (Signed)
[facility] Emergency Department Provider Note ____________________________________________  Time seen: 1321  I have reviewed the triage vital signs and the nursing notes.  HISTORY  Chief Complaint  Headache and Nausea  HPI Glenn Mejia is a 23 y.o. male presents himself to the ED for evaluation of a headache that is been persistent since Saturday, 3 days prior to arrival.  Patient without any significant medical history, presents with a mild, intermittent frontal headache, dry cough, chills, and nausea. He has a good appetite and some denies vomiting. He denies any sick contacts, high-risk exposures, or recent travel. He is not overly concerned for COVID-19, but his unusual symptoms, prompted his to report from work for evaluation. He denies any medical history, and takes no daily medicines.   Past Medical History:  Diagnosis Date  . Medical history non-contributory     There are no active problems to display for this patient.   Past Surgical History:  Procedure Laterality Date  . HARDWARE REMOVAL Left 08/20/2014   Procedure: HARDWARE REMOVAL;  Surgeon: Myrene GalasMichael Handy, MD;  [facility];  Service: Orthopedics;  Laterality: Left;  . ORIF ANKLE FRACTURE Left 03/12/2014   Procedure: LEFT ANKLE SYNDOSMOSIS REPAIR;  Surgeon: Budd PalmerMichael H Handy, MD;  Location: [facility] OR;  Service: Orthopedics;  Laterality: Left;  . WISDOM TOOTH EXTRACTION     left lower     Prior to Admission medications   Medication Sig Start Date End Date Taking? Authorizing Provider  ondansetron (ZOFRAN ODT) 4 MG disintegrating tablet Take 1 tablet (4 mg total) by mouth every 8 (eight) hours as needed. 10/03/18   Renesmee Raine, Charlesetta IvoryJenise V Bacon, PA-C    Allergies Shrimp [shellfish allergy]  Family History  Problem Relation Age of Onset  . Kidney Stones Mother     Social History Social History   Tobacco Use  . Smoking status: Never Smoker  . Smokeless tobacco: Never Used  Substance Use  Topics  . Alcohol use: Yes    Comment: some on weekend  . Drug use: Yes    Types: Marijuana    Review of Systems  Constitutional: Negative for fever. Reports chills. Eyes: Negative for visual changes. ENT: Negative for sore throat. Cardiovascular: Negative for chest pain. Respiratory: Negative for shortness of breath. Gastrointestinal: Negative for abdominal pain, vomiting and diarrhea. Reports nausea Genitourinary: Negative for dysuria. Musculoskeletal: Negative for back pain. Skin: Negative for rash. Neurological: Negative for focal weakness or numbness. Reports headaches ____________________________________________  PHYSICAL EXAM:  VITAL SIGNS: ED Triage Vitals  Enc Vitals Group     BP 10/03/18 1217 136/87     Pulse Rate 10/03/18 1217 69     Resp 10/03/18 1217 14     Temp 10/03/18 1217 97.7 F (36.5 C)     Temp Source 10/03/18 1217 Oral     SpO2 10/03/18 1217 96 %     Weight 10/03/18 1218 277 lb (125.6 kg)     Height 10/03/18 1218 5\' 11"  (1.803 m)     Head Circumference --      Peak Flow --      Pain Score 10/03/18 1218 2     Pain Loc --      Pain Edu? --      Excl. in GC? --     Constitutional: Alert and oriented. Well appearing and in no distress. Head: Normocephalic and atraumatic. Eyes: Conjunctivae are normal. PERRL. Normal extraocular movements Ears: Canals clear. TMs intact bilaterally. Nose: No congestion/rhinorrhea/epistaxis. Mouth/Throat: Mucous membranes  are moist. Neck: Supple. No thyromegaly. Hematological/Lymphatic/Immunological: No cervical lymphadenopathy. Cardiovascular: Normal rate, regular rhythm. Normal distal pulses. Respiratory: Normal respiratory effort. No wheezes/rales/rhonchi. Gastrointestinal: Soft and nontender. No distention. Musculoskeletal: Nontender with normal range of motion in all extremities.  Neurologic:  Normal gait without ataxia. Normal speech and language. No gross focal neurologic deficits are appreciated. Skin:   Skin is warm, dry and intact. No rash noted. Psychiatric: Mood and affect are normal. Patient exhibits appropriate insight and judgment. ____________________________________________   LABS (pertinent positives/negatives) Labs Reviewed  BASIC METABOLIC PANEL - Abnormal; Notable for the following components:      Result Value   Glucose, Bld 113 (*)    Calcium 8.8 (*)    All other components within normal limits  URINALYSIS, COMPLETE (UACMP) WITH MICROSCOPIC - Abnormal; Notable for the following components:   Color, Urine YELLOW (*)    APPearance CLEAR (*)    All other components within normal limits  SARS CORONAVIRUS 2 (HOSPITAL ORDER, PERFORMED IN  HOSPITAL LAB)  CBC  ____________________________________________  EKG  NSR 77 bpm Incomplete RBBB No STEMI ____________________________________________  PROCEDURES  Procedures Reglan 10 mg PO Tylenol 650 mg PO ____________________________________________  INITIAL IMPRESSION / ASSESSMENT AND PLAN / ED COURSE  Patient's vital signs are stable and within normal limits including having no fever and no tachycardia.  PERC negative.  Low risk for ACS.  No worries some travel or sick contacts that would suggest an increased risk of COVID-19.  This patient does meet criteria for send-out testing, and he will be placed on home quarantine until results are available. The work-up today is reassuring with no evidence of emergent medical condition that requires further work-up or evaluation or inpatient treatment.  I gave my usual and customary follow-up recommendations and return precautions and the patient understands and agrees with the plan.  He is discharged with a prescription for Zofran, and is encouraged to take Tylenol as needed for headache pain.  He will follow-up with [facility] for ongoing symptoms. A work note is provided, for the remainder the week.  ____________________________________________  FINAL CLINICAL IMPRESSION(S) /  ED DIAGNOSES  Final diagnoses:  Nausea  Acute nonintractable headache, unspecified headache type  Suspected Covid-19 Virus Infection      Karmen Stabs, Charlesetta Ivory, PA-C 10/03/18 1358    Jene Every, MD 10/03/18 1441

## 2018-10-03 NOTE — ED Triage Notes (Signed)
Says headache since Saturday.  No history of headaches.  Denies fever.

## 2018-10-03 NOTE — ED Notes (Signed)
Pt discharged home after verbalizing understanding of discharge instructions; nad noted. 

## 2018-10-03 NOTE — ED Notes (Signed)
E Signature unavailable; pt understands to quarantine until results received.

## 2018-10-03 NOTE — ED Notes (Signed)
Pt via pov from home with headache and nausea since Saturday. Denies vomiting, endorses diarrhea. 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. Pt alert & oriented with NAD noted.
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
